# Patient Record
Sex: Male | Born: 1950 | Race: White | Hispanic: No | Marital: Married | Smoking: Current every day smoker
Health system: Southern US, Community
[De-identification: ages and names within clinical notes are randomized; demographics above are authoritative.]

## PROBLEM LIST (undated history)

## (undated) DIAGNOSIS — M1990 Unspecified osteoarthritis, unspecified site: Secondary | ICD-10-CM

## (undated) HISTORY — PX: WRIST SURGERY: SHX841

## (undated) HISTORY — DX: Unspecified osteoarthritis, unspecified site: M19.90

---

## 1999-02-28 ENCOUNTER — Emergency Department (HOSPITAL_COMMUNITY): Admission: EM | Admit: 1999-02-28 | Discharge: 1999-02-28 | Payer: Self-pay | Admitting: Emergency Medicine

## 1999-02-28 ENCOUNTER — Encounter: Payer: Self-pay | Admitting: Emergency Medicine

## 1999-03-04 ENCOUNTER — Emergency Department (HOSPITAL_COMMUNITY): Admission: EM | Admit: 1999-03-04 | Discharge: 1999-03-04 | Payer: Self-pay | Admitting: Emergency Medicine

## 2004-07-24 ENCOUNTER — Emergency Department (HOSPITAL_COMMUNITY): Admission: EM | Admit: 2004-07-24 | Discharge: 2004-07-25 | Payer: Self-pay | Admitting: Emergency Medicine

## 2014-04-28 HISTORY — PX: REPLACEMENT TOTAL KNEE: SUR1224

## 2014-05-21 HISTORY — PX: MANIPULATION KNEE JOINT: SUR841

## 2016-05-29 ENCOUNTER — Encounter: Payer: Self-pay | Admitting: Pulmonary Disease

## 2016-05-29 ENCOUNTER — Ambulatory Visit (INDEPENDENT_AMBULATORY_CARE_PROVIDER_SITE_OTHER): Payer: PRIVATE HEALTH INSURANCE | Admitting: Pulmonary Disease

## 2016-05-29 VITALS — BP 116/62 | HR 88 | Ht 69.5 in | Wt 148.4 lb

## 2016-05-29 DIAGNOSIS — R059 Cough, unspecified: Secondary | ICD-10-CM

## 2016-05-29 DIAGNOSIS — R05 Cough: Secondary | ICD-10-CM

## 2016-05-29 DIAGNOSIS — Z720 Tobacco use: Secondary | ICD-10-CM

## 2016-05-29 MED ORDER — ALBUTEROL SULFATE HFA 108 (90 BASE) MCG/ACT IN AERS: 2.0000 | INHALATION_SPRAY | Freq: Four times a day (QID) | RESPIRATORY_TRACT | 11 refills | Status: DC | PRN

## 2016-05-29 MED ORDER — BENZONATATE 100 MG PO CAPS: 100.0000 mg | ORAL_CAPSULE | Freq: Three times a day (TID) | ORAL | 2 refills | Status: DC | PRN

## 2016-05-29 MED ORDER — VARENICLINE TARTRATE 1 MG PO TABS: 1.0000 mg | ORAL_TABLET | Freq: Two times a day (BID) | ORAL | 3 refills | Status: DC

## 2016-05-29 NOTE — Progress Notes (Signed)
   Subjective:    Patient ID: Jeff Ball, male    DOB: 1950/09/04, 66 y.o.   MRN: [id number]  HPI    Review of Systems  Constitutional: Negative for fever and unexpected weight change.  HENT: Positive for congestion and dental problem. Negative for ear pain, nosebleeds, postnasal drip, rhinorrhea, sinus pressure, sore throat and trouble swallowing.   Eyes: Negative for redness and itching.  Respiratory: Positive for cough. Negative for chest tightness, shortness of breath and wheezing.   Cardiovascular: Negative for palpitations and leg swelling.  Gastrointestinal: Negative for nausea and vomiting.  Genitourinary: Negative for dysuria.  Musculoskeletal: Positive for arthralgias and joint swelling.  Skin: Negative for rash.  Neurological: Negative for headaches.  Hematological: Does not bruise/bleed easily.  Psychiatric/Behavioral: Negative for dysphoric mood. The patient is not nervous/anxious.        Objective:   Physical Exam        Assessment & Plan:

## 2016-05-29 NOTE — Progress Notes (Signed)
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  Family history His family history includes CVA in his father; Obesity in his sister.  Social history He  reports that he has been smoking Cigarettes.  He has a 100.00 pack-year smoking history. He has never used smokeless tobacco. He reports that he drinks about 18.0 oz of alcohol per week . He reports that he does not use drugs.  No Known Allergies   Review of Systems  Constitutional: Negative for fever and unexpected weight change.  HENT: Positive for congestion and dental problem. Negative for ear pain, nosebleeds, postnasal drip, rhinorrhea, sinus pressure, sore throat and trouble swallowing.   Eyes: Negative for redness and itching.  Respiratory: Positive for cough. Negative for chest tightness, shortness of breath and wheezing.   Cardiovascular: Negative for palpitations and leg swelling.  Gastrointestinal: Negative for nausea and vomiting.  Genitourinary: Negative for dysuria.  Musculoskeletal: Positive for arthralgias and joint swelling.  Skin: Negative for rash.  Neurological: Negative for headaches.  Hematological: Does not bruise/bleed easily.  Psychiatric/Behavioral: Negative for dysphoric mood. The patient is not nervous/anxious.     No current outpatient prescriptions on file prior to visit.   No current facility-administered medications on file prior to visit.     Chief Complaint  Patient presents with  . PULMONARY CONSULT    Referred by Jeff Soho, PA for COPD. Pt c/o cough in AM with mucus production.     Past medical history He  has a past medical history of Arthritis.  Vital signs BP 116/62 (BP Location: Left Arm, Cuff Size: Normal)   Pulse 88   Ht 5' 9.5" (1.765 m)   Wt 148 lb 6.4 oz (67.3 kg)   SpO2 98%   BMI 21.60 kg/m   History of present illness Jeff Ball is a  66 y.o. male smoker for evaluation of COPD.  He was seen by his PCP recently.  He has extensive history of smoking.  He has intermittent cough, and has lost some weight recently.  There was concern he could have emphysema, and he was referred for further assessment.  He doesn't feel like his breathing limits his activity.  He gets some cough with congestion, but usually when he has a cold.  He is not aware of wheezing.  He denies sinus congestion, asthma, or hemoptysis.  His phlegm is clear when he coughs.    He denies history of pneumonia or tuberculosis.  No animal/bird exposures.  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  Physical exam  General - No distress, thin ENT - No sinus tenderness, no oral exudate, no LAN, no thyromegaly, TM clear, pupils equal/reactive Cardiac - s1s2 regular, no murmur, pulses symmetric Chest - No wheeze/rales/dullness, good air entry, normal respiratory excursion Back - No focal tenderness Abd - Soft, non-tender, no organomegaly, + bowel sounds Ext - No edema Neuro - Normal strength, cranial nerves intact Skin - No rashes Psych - Normal mood, and behavior   Discussion He has extensive history of smoking, and intermittent cough.  He is at risk for COPD and lung cancer.   Assessment/plan  Cough. - will arrange for PFT's to further assess for COPD - will have him try prn ventolin - refill tessalon  Tobacco abuse. - he is willing to quit - will have him try chantix >> discussed dosing schedule, setting quit date,  and side effects to monitor for  Lung cancer screening. - will arrange for referral to Kandice Robinsons to learn more about low dose CT screening program for lung cancer   Patient Instructions  Tessalon perles 100 mg three times per day as needed for cough  Ventolin two puffs up to four times per day as needed for cough, wheeze, or chest congestion  Will arrange for pulmonary function test  Chantix 1 mg pill >> 1 pill daily for  1st 3 days, then 1 pill twice per day.  Stop smoking after you have been on Chantix for 1 week  Will arrange for referral to Kandice Robinsons to assess for lung cancer screening program  Follow up in 6 weeks with Dr. Craige Cotta or Nurse Practitioner     Coralyn Helling, MD [facility] Pager:  [phone] 05/29/2016, 4:14 PM

## 2016-05-29 NOTE — Patient Instructions (Signed)
Tessalon perles 100 mg three times per day as needed for cough  Ventolin two puffs up to four times per day as needed for cough, wheeze, or chest congestion  Will arrange for pulmonary function test  Chantix 1 mg pill >> 1 pill daily for 1st 3 days, then 1 pill twice per day.  Stop smoking after you have been on Chantix for 1 week  Will arrange for referral to Kandice Robinsons to assess for lung cancer screening program  Follow up in 6 weeks with Dr. Craige Cotta or Nurse Practitioner

## 2016-06-01 ENCOUNTER — Telehealth: Payer: Self-pay | Admitting: Pulmonary Disease

## 2016-06-01 NOTE — Telephone Encounter (Signed)
Spoke with pharmacist and changed chantix continuation pack to starter pack.  Per 05/29/16 avs pt is being started on this medication and is titrating up to full dosage.  rx changed.  Nothing further needed.

## 2016-06-05 ENCOUNTER — Telehealth: Payer: Self-pay | Admitting: Pulmonary Disease

## 2016-06-05 NOTE — Telephone Encounter (Signed)
Spoke with pt's wife, states that Chantix is not working well for patient- difficulty sleeping, jittery.  Pt stopped taking medication yesterday, is requesting a rx for nicotine patches.  Pt's wife states this is covered through their insurance if it's a written rx.  Pt's wife would like to know what strength VS is recommending. Pt uses [facility] in [place].  VS please advise.  Thanks!

## 2016-06-07 NOTE — Telephone Encounter (Signed)
Send script for nicotine patch 14 mg daily.

## 2016-06-08 NOTE — Telephone Encounter (Signed)
Attempted to contact pt's wife. No answer, no option to leave a message. Will try back.  

## 2016-06-11 MED ORDER — NICOTINE 14 MG/24HR TD PT24: 14.0000 mg | MEDICATED_PATCH | Freq: Every day | TRANSDERMAL | 0 refills | Status: AC

## 2016-06-11 NOTE — Telephone Encounter (Signed)
ATC pt's wife. No option for VM.  Called pt's mobile number and informed him of the recs per VS. Rx sent to preferred pharmacy. Pt verbalized understanding and denied any further questions or concerns at this time.

## 2016-07-04 ENCOUNTER — Other Ambulatory Visit: Payer: Self-pay | Admitting: Acute Care

## 2016-07-04 DIAGNOSIS — F17210 Nicotine dependence, cigarettes, uncomplicated: Secondary | ICD-10-CM

## 2016-07-06 ENCOUNTER — Ambulatory Visit (INDEPENDENT_AMBULATORY_CARE_PROVIDER_SITE_OTHER): Payer: PRIVATE HEALTH INSURANCE | Admitting: Acute Care

## 2016-07-06 ENCOUNTER — Encounter: Payer: Self-pay | Admitting: Acute Care

## 2016-07-06 ENCOUNTER — Ambulatory Visit (INDEPENDENT_AMBULATORY_CARE_PROVIDER_SITE_OTHER)
Admission: RE | Admit: 2016-07-06 | Discharge: 2016-07-06 | Disposition: A | Discharge status: Home / Self Care | Payer: PRIVATE HEALTH INSURANCE | Source: Ambulatory Visit | Attending: Acute Care | Admitting: Acute Care

## 2016-07-06 DIAGNOSIS — Z87891 Personal history of nicotine dependence: Secondary | ICD-10-CM

## 2016-07-06 DIAGNOSIS — F17210 Nicotine dependence, cigarettes, uncomplicated: Secondary | ICD-10-CM

## 2016-07-06 NOTE — Progress Notes (Signed)
Shared Decision Making Visit Lung Cancer Screening Program [phone])   Eligibility:  Age 66 y.o.  Pack Years Smoking History Calculation 71 pack year smoking history (# packs/per year x # years smoked)  Recent History of coughing up blood  no  Unexplained weight loss? no ( >Than 15 pounds within the last 6 months )  Prior History Lung / other cancer no (Diagnosis within the last 5 years already requiring surveillance chest CT Scans).  Smoking Status Current Smoker  Former Smokers: Years since quit: NA  Quit Date: NA  Visit Components:  Discussion included one or more decision making aids. yes  Discussion included risk/benefits of screening.yes  Discussion included potential follow up diagnostic testing for abnormal scans. yes  Discussion included meaning and risk of over diagnosis. yes  Discussion included meaning and risk of False Positives. yes  Discussion included meaning of total radiation exposure. yes  Counseling Included:  Importance of adherence to annual lung cancer LDCT screening. yes  Impact of comorbidities on ability to participate in the program. yes  Ability and willingness to under diagnostic treatment. yes  Smoking Cessation Counseling:  Current Smokers:   Discussed importance of smoking cessation. yes  Information about tobacco cessation classes and interventions provided to patient. yes  Patient provided with "ticket" for LDCT Scan. yes  Symptomatic Patient. no  Counseling  Diagnosis Code: Tobacco Use Z72.0  Asymptomatic Patient yes  Counseling (Intermediate counseling: > three minutes counseling) K8768  Former Smokers:   Discussed the importance of maintaining cigarette abstinence. yes  Diagnosis Code: Personal History of Nicotine Dependence. T15.726  Information about tobacco cessation classes and interventions provided to patient. Yes  Patient provided with "ticket" for LDCT Scan. yes  Written Order for Lung Cancer  Screening with LDCT placed in Epic. Yes (CT Chest Lung Cancer Screening Low Dose W/O CM) [id number] Z12.2-Screening of respiratory organs Z87.891-Personal history of nicotine dependence  I have spent25 minutes of face to face time with Mr. Weisenberger discussing the risks and benefits of lung cancer screening. We viewed a power point together that explained in detail the above noted topics. We paused at intervals to allow for questions to be asked and answered to ensure understanding.We discussed that the single most powerful action that he can take to decrease his risk of developing lung cancer is to quit smoking. We discussed whether or not he is ready to commit to setting a quit date. He is currently not ready to set a quit date. We discussed options for tools to aid in quitting smoking including nicotine replacement therapy, non-nicotine medications, support groups, Quit Smart classes, and behavior modification. We discussed that often times setting smaller, more achievable goals, such as eliminating 1 cigarette a day for a week and then 2 cigarettes a day for a week can be helpful in slowly decreasing the number of cigarettes smoked. This allows for a sense of accomplishment as well as providing a clinical benefit. I gave Mr. Andaya the " Be Stronger Than Your Excuses" card with contact information for community resources, classes, free nicotine replacement therapy, and access to mobile apps, text messaging, and on-line smoking cessation help. I have also given him my card and contact information in the event he needs to contact me. We discussed the time and location of the scan, and that either Jerolyn Shin, CMA, or I will call with the results within 24-48 hours of receiving them. I have provided Mr. Shy with a copy of the power point we viewed  as a resource in the event they need reinforcement of the concepts we discussed today in the office. The patient verbalized understanding of all of  the above and had  no further questions upon leaving the office. They have my contact information in the event they have any further questions.  We discussed the fact that there is a high incidence of coronary artery disease noted on these exams. I explained that this is a non-gated exam and therefore degree of severity cannot be determined. I explained that the results of this scan would be faxed to the patient's primary care physician. I advised him to follow-up with his primary care provider regarding any findings of coronary artery disease.  I spent between 4 and 5 minutes counseling on smoking cessation this visit  Bevelyn Ngo, NP 07/06/2016

## 2016-07-10 ENCOUNTER — Ambulatory Visit: Payer: PRIVATE HEALTH INSURANCE | Admitting: Adult Health

## 2016-07-10 ENCOUNTER — Telehealth: Payer: Self-pay | Admitting: Acute Care

## 2016-07-10 NOTE — Telephone Encounter (Signed)
Spoke with pt's spouse, who is requesting pt's CT results from 07-09-16.  Kandice Robinsons please advise. Thanks.

## 2016-07-11 ENCOUNTER — Other Ambulatory Visit: Payer: Self-pay | Admitting: Acute Care

## 2016-07-11 DIAGNOSIS — F17210 Nicotine dependence, cigarettes, uncomplicated: Secondary | ICD-10-CM

## 2016-07-11 NOTE — Telephone Encounter (Signed)
I called Mr. Jeff Ball with results of his low-dose screening CT. I explained that his scan was read as a lung RADS 2, nodules that are benign in appearance or behavior. Recommendation per radiology is for continued annual screening with low-dose chest CT in 12 months. I explained that we will order and schedule the exam for February 2019. I did discuss the additional finding of aortic atherosclerosis and coronary artery calcification. I explained that in this non-gated exam degree or severity could not be determined. He is currently not on statin medication, however he has recently had his cholesterol checked and he states that he will follow-up with his primary care physician. He verbalized understanding of the above and had no further questions at completion of the call.

## 2016-09-14 ENCOUNTER — Telehealth: Payer: Self-pay | Admitting: Pulmonary Disease

## 2016-09-14 NOTE — Telephone Encounter (Signed)
atc pt X3, line rang to fast busy signal wcb 

## 2016-09-14 NOTE — Telephone Encounter (Signed)
Can be reached @ [phone].Caren Griffins

## 2016-09-17 NOTE — Telephone Encounter (Signed)
Pt is requesting a refill of his Tessalon Perles and also a new Rx for a cough syrup.  Please advise Dr Craige Cotta. Thanks.

## 2016-09-17 NOTE — Telephone Encounter (Signed)
[phone] calling a/b refill request.[name]'

## 2016-09-17 NOTE — Telephone Encounter (Signed)
atc pt at callback number listed X3, message states "your call cannot be completed as dialed- please try again later". atc pt on mobile number listed, no answer, vm full.  wcb.

## 2016-09-19 MED ORDER — BENZONATATE 100 MG PO CAPS: 100.0000 mg | ORAL_CAPSULE | Freq: Three times a day (TID) | ORAL | 3 refills | Status: DC | PRN

## 2016-09-19 NOTE — Telephone Encounter (Signed)
Okay to send script for tessalon 100 mg tid prn, #30 with 3 refills.  He can try OTC delsym 30 ml bid prn for cough.    If this does not improve his symptoms then he can call back for additional advise.

## 2016-09-19 NOTE — Telephone Encounter (Signed)
Spoke with the pt and notified of recs per VS  He verbalized understanding  Rx sent to pharm

## 2017-04-13 IMAGING — CT CT CHEST LUNG CANCER SCREENING LOW DOSE W/O CM
2 of 5 series · 15 of 40 positions shown, 18 images · non-contrast
Comparison: None.

CLINICAL DATA: Low dose Lung cancer screening. Seventy pack-year
history.

EXAM:
CT CHEST WITHOUT CONTRAST LOW-DOSE FOR LUNG CANCER SCREENING
TECHNIQUE: Multidetector CT imaging of the chest was performed following the
standard protocol without IV contrast.

[Series 4: lung thins 1.0 · axial · 0.74mm/px · z∈[-242,+38]mm · 12 of 308 slices shown, 15 images]
[im 14/308  mediastinal]
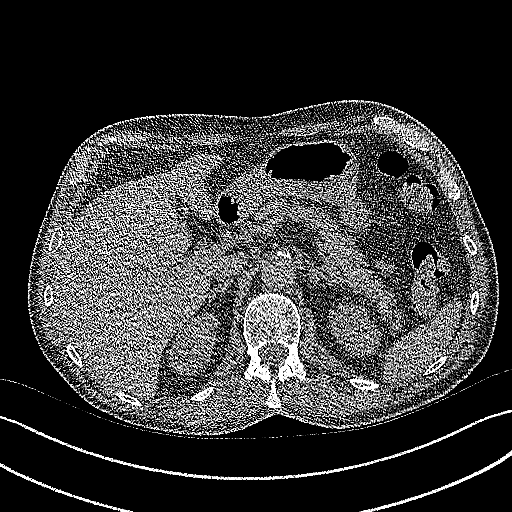
[im 14/308  lung]
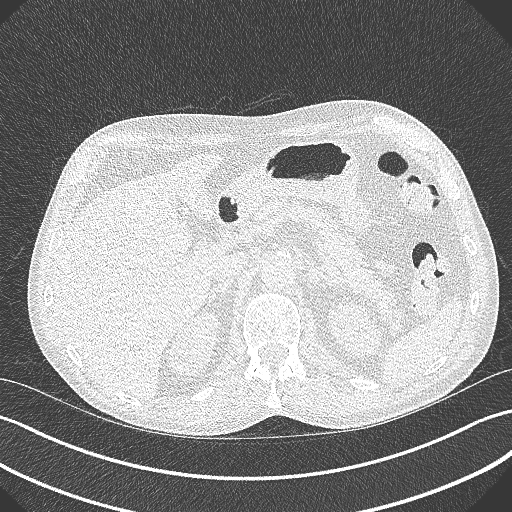
[im 42/308  lung]
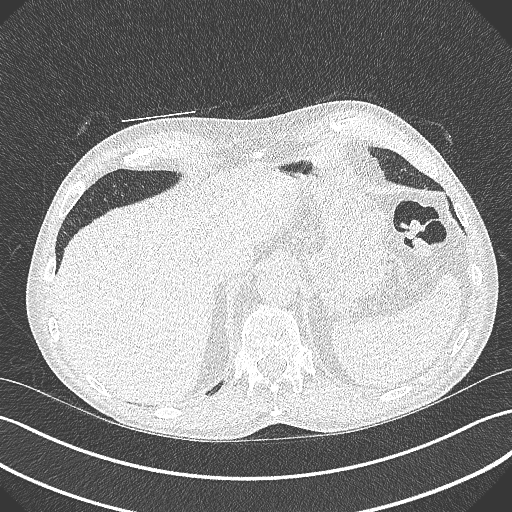
[im 70/308  lung]
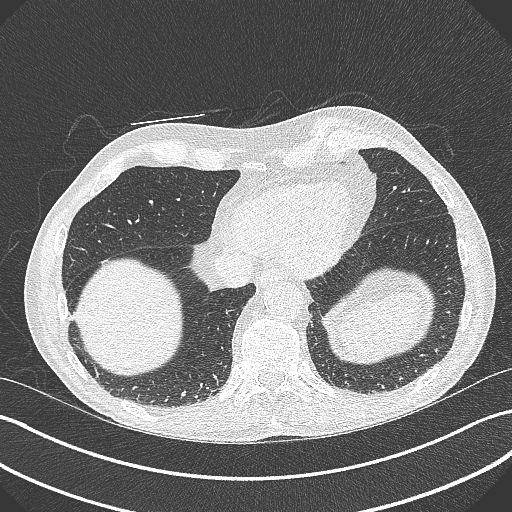
[im 98/308  lung]
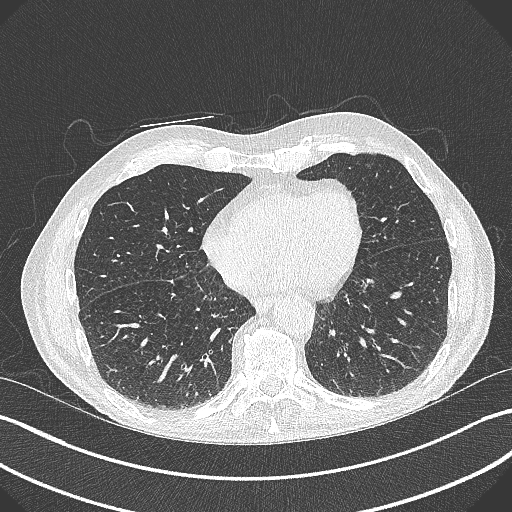
[im 112/308  mediastinal]
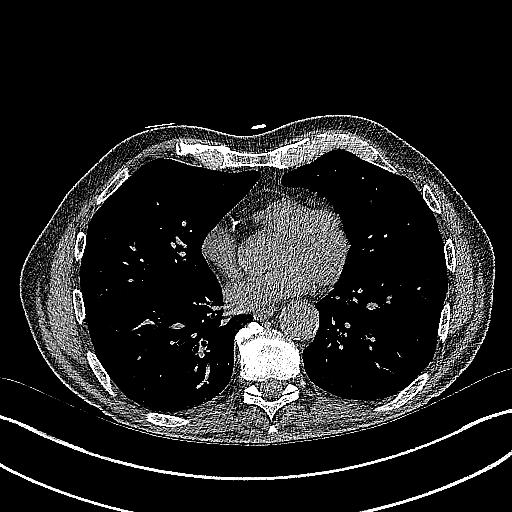
[im 112/308  lung]
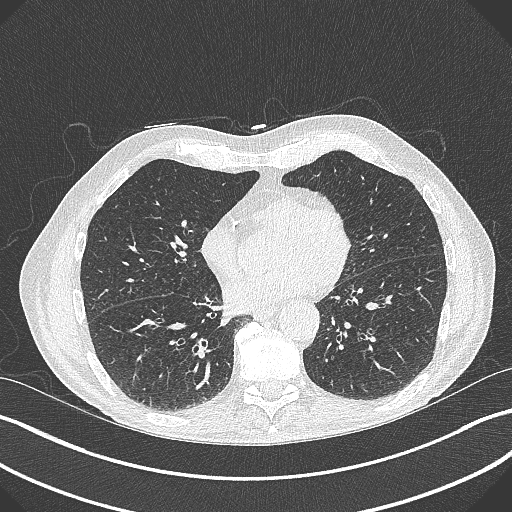
[im 140/308  lung]
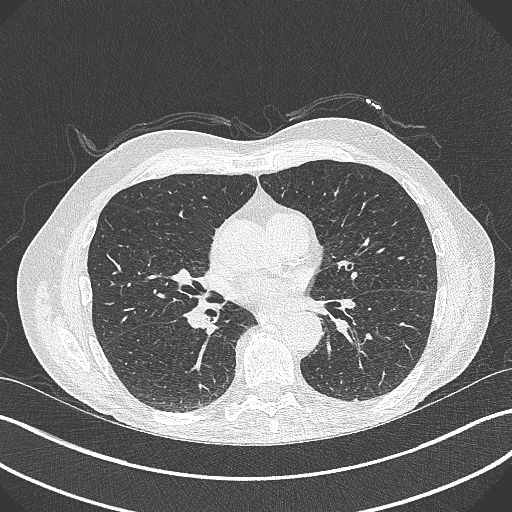
[im 168/308  lung]
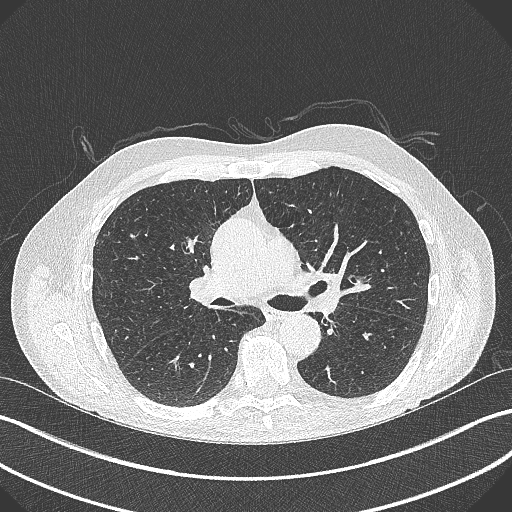
[im 196/308  lung]
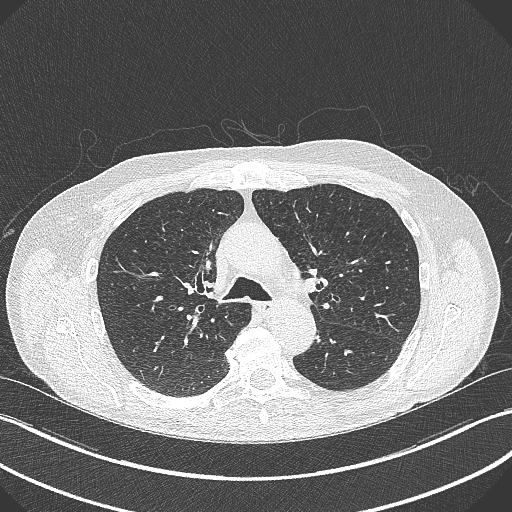
[im 210/308  mediastinal]
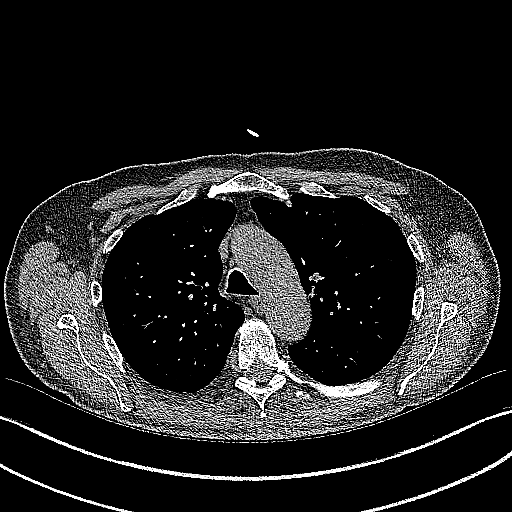
[im 210/308  lung]
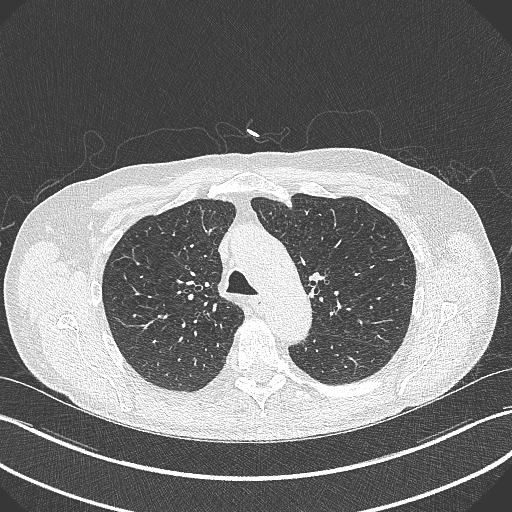
[im 238/308  lung]
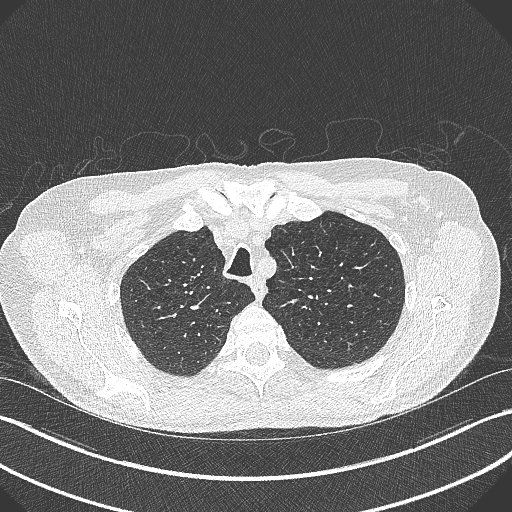
[im 266/308  lung]
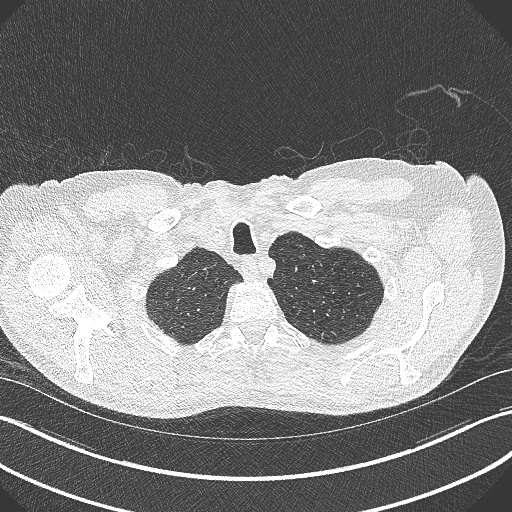
[im 294/308  lung]
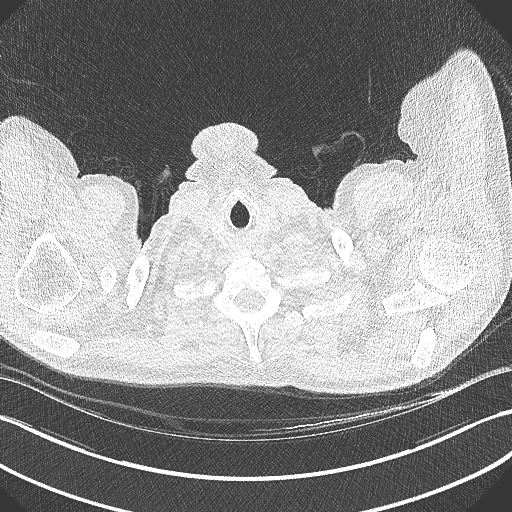

[Series 6: coronal · coronal · 0.63mm/px · 3 of 120 slices shown]
[im 24/120  lung]
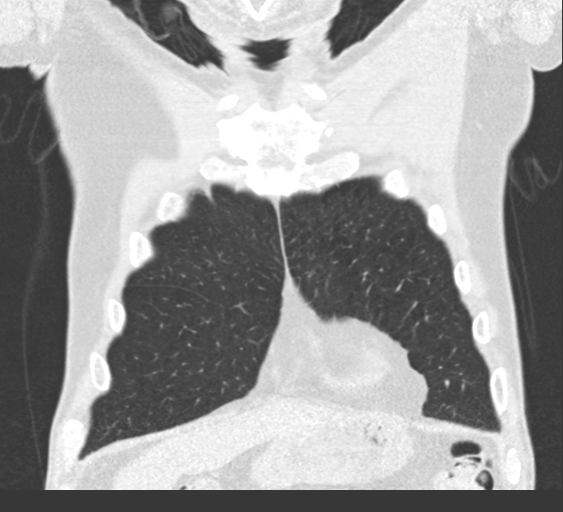
[im 48/120  lung]
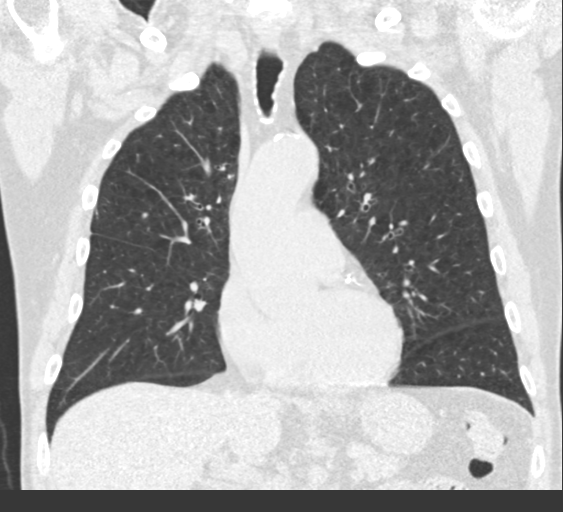
[im 72/120  lung]
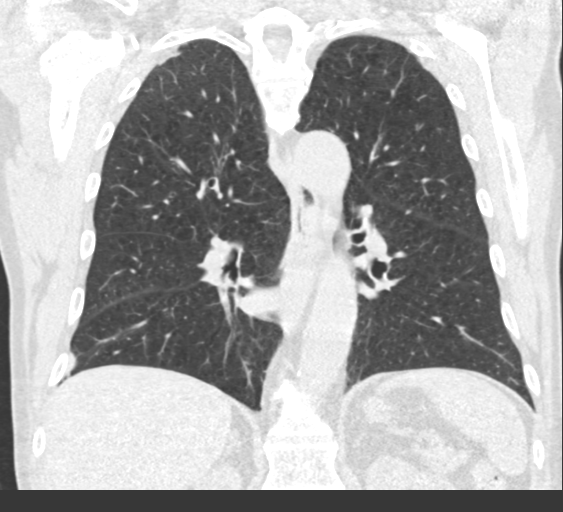

[15 of 40 positions shown; findings below may reference images not displayed]

FINDINGS: Cardiovascular: The heart size appears. Aortic atherosclerosis.
Calcification involving the RCA, LAD coronary artery noted. No
pericardial effusion.

Mediastinum/Nodes: The trachea appears patent and is midline. Normal
appearance of the esophagus. No enlarged mediastinal or hilar lymph
nodes. No axillary or supraclavicular adenopathy.

Lungs/Pleura: No pleural fluid. Mild changes of centrilobular
emphysema identified. Diffuse bronchial wall thickening. A few small
scattered pulmonary nodules are identified. The largest is in the
superior segment of low right lower lobe measuring 4.4 mm.

Upper Abdomen: No acute abnormality.

Musculoskeletal: No aggressive lytic or sclerotic bone lesions.
IMPRESSION: 1. Lung-RADS Category 2, benign appearance or behavior. Continue
annual screening with low-dose chest CT without contrast in 12
months
2. Aortic atherosclerosis and coronary artery calcification.
3. Diffuse bronchial wall thickening with emphysema, as above;
imaging findings suggestive of underlying COPD.

## 2017-06-03 ENCOUNTER — Telehealth: Payer: Self-pay | Admitting: Pulmonary Disease

## 2017-06-03 NOTE — Telephone Encounter (Signed)
Called and spoke with pt letting him know we needed him to come in for an ov before we sent in a Rx of his tessalon. Pt was supposed to have had an appt with Rubye Oaks, NP 07/10/16 with a PFT before but he no showed both of those visits.  Pt did not know what I was talking about stating to me that he had all the tests done and stated he did not no show for any visits.  Pt wanted me to speak with his wife to relay the information I just stated to him.  Spoke with pt's wife relaying the information I had just stated to her husband about [place] needing him to come in for an ov after he no showed for the pft and ov with TP.  Pt's wife stated when he was there on 07/06/16 he went over to [facility] and had a test done that same day.  I stated to her that that was the CT scan for the lung screening program, not the PFT which he was supposed to have had on 07/10/16 which was a follow up appt after his visit with VS when he had the initial consult.  Information I relayed to them was finally understood as pt's wife stated she ended up having to work on 07/10/16 and stated she forgot to call our office to cancel pt's appts.  appt was scheduled for 06/21/17 for a PFT followed by an OV with TP. Stated to them that at that visit, we could refill tessalon if okayed by TP but would wait to see if pt showed up for that visit first.  Nothing further needed.

## 2017-06-20 ENCOUNTER — Other Ambulatory Visit: Payer: Self-pay | Admitting: Pulmonary Disease

## 2017-06-20 DIAGNOSIS — R0600 Dyspnea, unspecified: Secondary | ICD-10-CM

## 2017-06-21 ENCOUNTER — Ambulatory Visit (INDEPENDENT_AMBULATORY_CARE_PROVIDER_SITE_OTHER): Payer: Medicare Other | Admitting: Pulmonary Disease

## 2017-06-21 ENCOUNTER — Encounter: Payer: Self-pay | Admitting: Adult Health

## 2017-06-21 ENCOUNTER — Ambulatory Visit (INDEPENDENT_AMBULATORY_CARE_PROVIDER_SITE_OTHER): Payer: Medicare Other | Admitting: Adult Health

## 2017-06-21 DIAGNOSIS — F17200 Nicotine dependence, unspecified, uncomplicated: Secondary | ICD-10-CM | POA: Diagnosis not present

## 2017-06-21 DIAGNOSIS — IMO0001 Reserved for inherently not codable concepts without codable children: Secondary | ICD-10-CM | POA: Insufficient documentation

## 2017-06-21 DIAGNOSIS — R0600 Dyspnea, unspecified: Secondary | ICD-10-CM | POA: Diagnosis not present

## 2017-06-21 DIAGNOSIS — J449 Chronic obstructive pulmonary disease, unspecified: Secondary | ICD-10-CM

## 2017-06-21 LAB — PULMONARY FUNCTION TEST
DL/VA % PRED: 65 %
DL/VA: 2.98 ml/min/mmHg/L
DLCO unc % pred: 51 %
DLCO unc: 16.17 ml/min/mmHg
FEF 25-75 POST: 0.63 L/s
FEF 25-75 Pre: 1.2 L/sec
FEF2575-%Change-Post: -47 %
FEF2575-%PRED-POST: 23 %
FEF2575-%PRED-PRE: 44 %
FEV1-%Change-Post: -20 %
FEV1-%PRED-PRE: 59 %
FEV1-%Pred-Post: 46 %
FEV1-PRE: 2.01 L
FEV1-Post: 1.59 L
FEV1FVC-%CHANGE-POST: -8 %
FEV1FVC-%PRED-PRE: 77 %
FEV6-%Change-Post: -12 %
FEV6-%Pred-Post: 69 %
FEV6-%Pred-Pre: 79 %
FEV6-POST: 3.01 L
FEV6-Pre: 3.45 L
FEV6FVC-%Change-Post: 0 %
FEV6FVC-%Pred-Post: 105 %
FEV6FVC-%Pred-Pre: 104 %
FVC-%Change-Post: -13 %
FVC-%PRED-POST: 66 %
FVC-%Pred-Pre: 76 %
FVC-POST: 3.01 L
FVC-PRE: 3.47 L
POST FEV1/FVC RATIO: 53 %
PRE FEV1/FVC RATIO: 58 %
PRE FEV6/FVC RATIO: 99 %
Post FEV6/FVC ratio: 100 %
RV % pred: 129 %
RV: 2.97 L
TLC % pred: 84 %
TLC: 5.87 L

## 2017-06-21 MED ORDER — BENZONATATE 100 MG PO CAPS: 100.0000 mg | ORAL_CAPSULE | Freq: Three times a day (TID) | ORAL | 3 refills | Status: DC | PRN

## 2017-06-21 NOTE — Progress Notes (Signed)
@Patient  ID: Jeff Ball, male    DOB: Jul 09, 1950, 67 y.o.   MRN: [id number]  Chief Complaint  Patient presents with  . Follow-up    Cough     Referring provider: Sigmund Hazel, MD  HPI:  67 year old male active smoker (heavy 2 PPD )  followed for COPD  TEST   06/21/2017 Follow up : COPD  Patient presents for a one year follow-up.  Patient was seen last visit 1 year ago for an evaluation for COPD.  He did not return until today for a PFT.  PFT today shows moderate COPD with an FEV1 at 59%, ratio 58, FVC 76% DLCO 51%.  Patient continues to smoke.  Smoking cessation was discussed.  Patient underwent lung cancer low-dose CT screening February 2018 that showed a benign appearance he has a follow-up scheduled later this month.  CT did show emphysema.. He says gets sob with walking long distance. Can not do a lot of activities due to knee issues. Disabled due to knees.  Has no intention to quit smoking . Says as long as they make them he is going to smoke them.  We discussed  Uses Albuterol inhaler rarely.  Wife is with him today , they did a quite of bit of arguing during exam.   He is skydiver .   No Known Allergies  Immunization History  Administered Date(s) Administered  . Tdap 07/08/2014    Past Medical History:  Diagnosis Date  . Arthritis     Tobacco History: Social History   Tobacco Use  Smoking Status Current Every Day Smoker  . Packs/day: 2.50  . Years: 40.00  . Pack years: 100.00  . Types: Cigarettes  Smokeless Tobacco Never Used   Ready to quit: No Counseling given: Yes   Outpatient Encounter Medications as of 06/21/2017  Medication Sig  . albuterol (VENTOLIN HFA) 108 (90 Base) MCG/ACT inhaler Inhale 2 puffs into the lungs every 6 (six) hours as needed for wheezing or shortness of breath.  . Aspirin-Acetaminophen (GOODY BODY PAIN) 500-325 MG PACK as needed.  . benzonatate (TESSALON) 100 MG capsule Take 1 capsule (100 mg total) by mouth 3 (three) times  daily as needed for cough.  . Homeopathic Products (ZICAM COLD REMEDY) TBDP Take 1 tablet by mouth every 4 (four) hours as needed.  . nicotine (NICODERM CQ - DOSED IN MG/24 HOURS) 14 mg/24hr patch Place 1 patch (14 mg total) onto the skin daily.  Marland Kitchen Dextromethorphan-Guaifenesin (TUSSIN DM PO) Take by mouth.  . [DISCONTINUED] varenicline (CHANTIX) 1 MG tablet Take 1 tablet (1 mg total) by mouth 2 (two) times daily. (Patient not taking: Reported on 06/21/2017)   No facility-administered encounter medications on file as of 06/21/2017.      Review of Systems  Constitutional:   No  weight loss, night sweats,  Fevers, chills, fatigue, or  lassitude.  HEENT:   No headaches,  Difficulty swallowing,  Tooth/dental problems, or  Sore throat,                No sneezing, itching, ear ache, nasal congestion, post nasal drip,   CV:  No chest pain,  Orthopnea, PND, swelling in lower extremities, anasarca, dizziness, palpitations, syncope.   GI  No heartburn, indigestion, abdominal pain, nausea, vomiting, diarrhea, change in bowel habits, loss of appetite, bloody stools.   Resp: No shortness of breath with exertion or at rest.  No excess mucus, no productive cough,  No non-productive cough,  No coughing up  of blood.  No change in color of mucus.  No wheezing.  No chest wall deformity  Skin: no rash or lesions.  GU: no dysuria, change in color of urine, no urgency or frequency.  No flank pain, no hematuria   MS:  No joint pain or swelling.  No decreased range of motion.  No back pain.    Physical Exam  BP 130/80 (BP Location: Left Arm, Cuff Size: Normal)   Pulse 85   Ht 5\' 9"  (1.753 m)   Wt 138 lb (62.6 kg)   SpO2 97%   BMI 20.38 kg/m   GEN: A/Ox3; pleasant , NAD, thin , walks with cane.    HEENT:  [place]/AT,  EACs-clear, TMs-wnl, NOSE-clear, THROAT-clear, no lesions, no postnasal drip or exudate noted. Poor dentition   NECK:  Supple w/ fair ROM; no JVD; normal carotid impulses w/o bruits; no  thyromegaly or nodules palpated; no lymphadenopathy.    RESP  Clear  P & A; w/o, wheezes/ rales/ or rhonchi. no accessory muscle use, no dullness to percussion  CARD:  RRR, no m/r/g, no peripheral edema, pulses intact, no cyanosis or clubbing.  GI:   Soft & nt; nml bowel sounds; no organomegaly or masses detected.   Musco: Warm bil, no deformities or joint swelling noted.   Neuro: alert, no focal deficits noted.    Skin: Warm, no lesions or rashes    Lab Results:  CBC No results found for: WBC, RBC, HGB, HCT, PLT, MCV, MCH, MCHC, RDW, LYMPHSABS, MONOABS, EOSABS, BASOSABS  BMET No results found for: NA, K, CL, CO2, GLUCOSE, BUN, CREATININE, CALCIUM, GFRNONAA, GFRAA  BNP No results found for: BNP  ProBNP No results found for: PROBNP  Imaging: No results found.   Assessment & Plan:   COPD (chronic obstructive pulmonary disease) (HCC) Moderate COPD -active smoker   Plan  Patient Instructions  Work on cutting back on smoking . Get down to <1 pack a day .  Use Albuterol As needed   Follow up with Dr. Craige Cotta  In 6 months and As needed        Smoking Smoking cessation      Rubye Oaks, NP 06/21/2017

## 2017-06-21 NOTE — Progress Notes (Signed)
PFT completed today 06/21/17  

## 2017-06-21 NOTE — Addendum Note (Signed)
Addended by: Pilar Grammes on: 06/21/2017 04:16 PM   Modules accepted: Orders

## 2017-06-21 NOTE — Assessment & Plan Note (Signed)
Smoking cessation  

## 2017-06-21 NOTE — Assessment & Plan Note (Signed)
Moderate COPD -active smoker   Plan  Patient Instructions  Work on cutting back on smoking . Get down to <1 pack a day .  Use Albuterol As needed   Follow up with Dr. Craige Cotta  In 6 months and As needed

## 2017-06-21 NOTE — Patient Instructions (Addendum)
Work on cutting back on smoking . Get down to <1 pack a day .  Use Albuterol As needed   Follow up with Dr. Craige Cotta  In 6 months and As needed

## 2017-06-25 NOTE — Progress Notes (Signed)
Reviewed and agree with assessment/plan.   Jeneen Doutt, MD [place] [facility] 05/16/2016, 12:24 PM Pager:  [phone]  

## 2017-07-09 ENCOUNTER — Ambulatory Visit (HOSPITAL_BASED_OUTPATIENT_CLINIC_OR_DEPARTMENT_OTHER)
Admission: RE | Admit: 2017-07-09 | Discharge: 2017-07-09 | Disposition: A | Discharge status: Home / Self Care | Payer: Medicare Other | Source: Ambulatory Visit | Attending: Acute Care | Admitting: Acute Care

## 2017-07-09 DIAGNOSIS — I700 Atherosclerosis of aorta: Secondary | ICD-10-CM | POA: Insufficient documentation

## 2017-07-09 DIAGNOSIS — J432 Centrilobular emphysema: Secondary | ICD-10-CM | POA: Diagnosis not present

## 2017-07-09 DIAGNOSIS — F17210 Nicotine dependence, cigarettes, uncomplicated: Secondary | ICD-10-CM | POA: Insufficient documentation

## 2017-07-09 DIAGNOSIS — Z122 Encounter for screening for malignant neoplasm of respiratory organs: Secondary | ICD-10-CM | POA: Diagnosis not present

## 2017-07-15 ENCOUNTER — Telehealth: Payer: Self-pay | Admitting: Adult Health

## 2017-07-15 NOTE — Telephone Encounter (Signed)
ATC pt, no answer. Left message for pt to call back.  

## 2017-07-15 NOTE — Telephone Encounter (Signed)
  Please call patient and let him know his low dose CT was read as a Lung RADS 2: nodules that are benign in appearance and behavior with a very low likelihood of becoming a clinically active cancer due to size or lack of growth. Recommendation per radiology is for a repeat LDCT in 12 months.Please let him know we will order and schedule his annual screening scan for 06/2018. Please let him know his scan did show CAD. This was also noted on last years scan. Per Epic he is not on statin therapy.  Please remind the patient  that this is a non-gated exam therefore degree or severity of disease  cannot be determined. Please have him follow up with his PCP regarding potential risk factor modification, dietary therapy or pharmacologic therapy if clinically indicated.  Angelique Blonder, Please place order for annual CT scan for 06/2018 and fax results to PCP. Thanks so much.    IMPRESSION: 1. Lung-RADS 2, benign appearance or behavior. Continue annual screening with low-dose chest CT without contrast in 12 months. 2. Three-vessel coronary atherosclerosis.

## 2017-07-15 NOTE — Telephone Encounter (Signed)
SG please advise on CT results.  

## 2017-07-16 MED ORDER — ALBUTEROL SULFATE HFA 108 (90 BASE) MCG/ACT IN AERS: 2.0000 | INHALATION_SPRAY | Freq: Four times a day (QID) | RESPIRATORY_TRACT | 11 refills | Status: AC | PRN

## 2017-07-16 MED ORDER — BENZONATATE 100 MG PO CAPS: 100.0000 mg | ORAL_CAPSULE | Freq: Three times a day (TID) | ORAL | 3 refills | Status: DC | PRN

## 2017-07-16 NOTE — Telephone Encounter (Signed)
Kerrie Buffalo", patient's wife returning call, CB is [phone]

## 2017-07-16 NOTE — Telephone Encounter (Signed)
Called and spoke with pt's wife Graciella Belton letting her know the results of pt's CT scan and that we would have him repeat scan 1 year from now.  Also stated to her that pt needs to follow up with PCP to discuss options based on CAD that was seen on CT scan.  Pt not currently on any statin med.  Graciella Belton stated that pt needed a refill of tessalon as well as albuterol inhaler.  I stated to her I would take care of calling those in to pt's pharmacy. Nothing further needed at this current time.

## 2017-07-16 NOTE — Telephone Encounter (Signed)
Called pt, no vm to leave message

## 2017-07-17 ENCOUNTER — Other Ambulatory Visit: Payer: Self-pay | Admitting: Acute Care

## 2017-07-17 DIAGNOSIS — F17210 Nicotine dependence, cigarettes, uncomplicated: Principal | ICD-10-CM

## 2017-07-17 DIAGNOSIS — Z122 Encounter for screening for malignant neoplasm of respiratory organs: Secondary | ICD-10-CM

## 2017-12-20 ENCOUNTER — Ambulatory Visit: Payer: Medicare Other | Admitting: Pulmonary Disease

## 2018-01-13 ENCOUNTER — Ambulatory Visit (INDEPENDENT_AMBULATORY_CARE_PROVIDER_SITE_OTHER): Payer: Medicare Other | Admitting: Pulmonary Disease

## 2018-01-13 ENCOUNTER — Encounter: Payer: Self-pay | Admitting: Pulmonary Disease

## 2018-01-13 ENCOUNTER — Other Ambulatory Visit: Payer: Medicare Other

## 2018-01-13 VITALS — BP 128/70 | HR 86 | Ht 69.0 in | Wt 137.4 lb

## 2018-01-13 DIAGNOSIS — Z720 Tobacco use: Secondary | ICD-10-CM

## 2018-01-13 DIAGNOSIS — J432 Centrilobular emphysema: Secondary | ICD-10-CM | POA: Diagnosis not present

## 2018-01-13 DIAGNOSIS — Z23 Encounter for immunization: Secondary | ICD-10-CM

## 2018-01-13 NOTE — Patient Instructions (Signed)
Prevnar shot today Lab test today Follow up in 6 months

## 2018-01-13 NOTE — Progress Notes (Signed)
[facility]  Chief Complaint  Patient presents with  . Follow-up    COPD follow-up    Constitutional: BP 128/70 (BP Location: Left Arm, Patient Position: Sitting, Cuff Size: Normal)   Pulse 86   Ht 5\' 9"  (1.753 m)   Wt 137 lb 6.4 oz (62.3 kg)   SpO2 97%   BMI 20.29 kg/m   History of Present Illness: Jeff Ball is a 67 y.o. 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  He still smokes.  He likes to smoke too much, and isn't interested in trying to quit at this time.  He isn't having cough, wheeze, chest congestion, chest pain, fever, hemoptysis, sinus congestion, sore throat, dysphagia, abdominal pain, or leg swelling.  He doesn't feel his breathing limits his activity, but he is not very active.  He has trouble walking related to knee pain.  His PFT showed mild to moderate obstruction and diffusion defect.  His CT chest showed emphysema.   Comprehensive Respiratory Exam:  Appearance - thin ENMT - nasal mucosa moist, turbinates clear, midline nasal septum, poor dentition, no gingival bleeding, no oral exudates, no tonsillar hypertrophy Neck - no masses, trachea midline, no thyromegaly, no elevation in JVP Respiratory - normal appearance of chest wall, normal respiratory effort w/o accessory muscle use, no dullness on percussion, no wheezing or rales CV - s1s2 regular rate and rhythm, no murmurs, no peripheral edema, radial pulses symmetric GI - soft, non tender, no masses Lymph - no adenopathy noted in neck and axillary areas MSK - normal muscle strength and tone, walks with a cane Ext - no cyanosis, clubbing, or joint inflammation noted Skin - no rashes, lesions, or ulcers Neuro - oriented to person, place, and time Psych - normal mood and affect   Assessment/Plan:  COPD with emphysema. - reviewed his CT chest and PFT findings - continue prn albuterol for now - discussed natural history of disease process, especially if he continues  to smoke - prevnar shot today - he will plan to get flu shot later this season - check A1AT level - will need pneumovax after August of 2020  Tobacco abuse. - discussed options to assist with smoking cessation - he will think about these and call if he feels he needs help to quit smoking   Patient Instructions  Prevnar shot today Lab test today Follow up in 6 months    Coralyn Helling, MD [facility]/Critical Care 01/13/2018, 12:42 PM  Flow Sheet  Pulmonary tests: PFT 06/21/17 >> FEV1 2.01 (59%), FEV1% 58, TLC 5.87 (84%), DLCO 51%, no BD CT chest 07/10/17 (reviewed by me) >> moderate centrilobular emphysema, atherosclerosis  Past Medical History: He  has a past medical history of Arthritis.  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  Family History: His family history includes CVA in his father; Obesity in his sister.  Social History: He  reports that he has been smoking cigarettes. He has a 100.00 pack-year smoking history. He has never used smokeless tobacco. He reports that he drinks about 30.0 standard drinks of alcohol per week. He reports that he does not use drugs.  Medications: Allergies as of 01/13/2018   No Known Allergies     Medication List        Accurate as of 01/13/18 12:42 PM. Always use your most recent med list.  albuterol 108 (90 Base) MCG/ACT inhaler Commonly known as:  PROVENTIL HFA;VENTOLIN HFA Inhale 2 puffs into the lungs every 6 (six) hours as needed for wheezing or shortness of breath.   benzonatate 100 MG capsule Commonly known as:  TESSALON Take 1 capsule (100 mg total) by mouth 3 (three) times daily as needed for cough.   GOODY BODY PAIN 500-325 MG Pack Generic drug:  Aspirin-Acetaminophen as needed.   nicotine 14 mg/24hr patch Commonly known as:  NICODERM CQ - dosed in mg/24 hours Place 1  patch (14 mg total) onto the skin daily.   TUSSIN DM PO Take by mouth.   ZICAM COLD REMEDY Tbdp Take 1 tablet by mouth every 4 (four) hours as needed.

## 2018-01-16 LAB — ALPHA-1 ANTITRYPSIN PHENOTYPE: A-1 Antitrypsin, Ser: 180 mg/dL (ref 83–199)

## 2018-01-17 ENCOUNTER — Telehealth: Payer: Self-pay | Admitting: Pulmonary Disease

## 2018-01-17 NOTE — Telephone Encounter (Signed)
LMTCB

## 2018-01-21 NOTE — Telephone Encounter (Signed)
LMTCB

## 2018-01-21 NOTE — Telephone Encounter (Signed)
Called and spoke with the patient and made him aware of these results and he verbalized understanding.Nothing further needed at this time.

## 2018-01-21 NOTE — Telephone Encounter (Signed)
Called patient unable to reach left message to give us a call back.

## 2018-01-21 NOTE — Telephone Encounter (Signed)
A1AT 01/13/18 >> 180, MZ   Attempted to call pt.  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  His levels are adequate, and nothing additional needed for him except it is absolutely necessary that he stop smoking.    He should also inform his blood relatives of this, and then they can d/w their doctors about whether testing for them is needed.

## 2018-01-21 NOTE — Telephone Encounter (Signed)
Pt return call; pt contact number [phone]

## 2018-01-21 NOTE — Telephone Encounter (Signed)
Spoke with pt's wife, Sedalia Muta. She is requesting his lab results from 01/13/18.  Dr. Craige Cotta - please advise. Thanks.

## 2018-01-21 NOTE — Telephone Encounter (Signed)
Pt is calling back [phone]

## 2018-04-25 DIAGNOSIS — R319 Hematuria, unspecified: Secondary | ICD-10-CM | POA: Diagnosis not present

## 2018-06-24 ENCOUNTER — Ambulatory Visit (INDEPENDENT_AMBULATORY_CARE_PROVIDER_SITE_OTHER): Payer: Medicare Other | Admitting: Pulmonary Disease

## 2018-06-24 ENCOUNTER — Encounter: Payer: Self-pay | Admitting: Pulmonary Disease

## 2018-06-24 VITALS — BP 124/74 | HR 112 | Ht 68.0 in | Wt 132.0 lb

## 2018-06-24 DIAGNOSIS — J432 Centrilobular emphysema: Secondary | ICD-10-CM | POA: Diagnosis not present

## 2018-06-24 DIAGNOSIS — E8801 Alpha-1-antitrypsin deficiency: Secondary | ICD-10-CM | POA: Diagnosis not present

## 2018-06-24 DIAGNOSIS — Z720 Tobacco use: Secondary | ICD-10-CM

## 2018-06-24 NOTE — Progress Notes (Signed)
[name]  Chief Complaint  Patient presents with  . Follow-up    Pt doing well overall, no changes since last ov.    Constitutional:  BP 124/74 (BP Location: Left Arm, Cuff Size: Normal)   Pulse (!) 112   Ht 5\' 8"  (1.727 m)   Wt 132 lb (59.9 kg)   SpO2 97%   BMI 20.07 kg/m   Past Medical History:  Arthritis.  Brief Summary:  Jeff Ball is a 68 y.o. 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  He is MZ alpha 1 heterozygote.  He continues to smoke cigarettes. He likes smoking and doesn't want to quit at this time.  He understands how this can impact his health.  He isn't having cough, wheeze, or chest congestion.  Doesn't feel like his breathing limits his activities.   Physical Exam:   Appearance - well kempt   ENMT - clear nasal mucosa, midline nasal  septum, no oral exudates, no LAN, trachea midline, poor dentition  Respiratory - normal chest wall, normal respiratory effort, no accessory muscle use, no wheeze/rales, decreased breath sounds  CV - s1s2 regular rate and rhythm, no murmurs, no peripheral edema, radial pulses symmetric  GI - soft, non tender, no masses  Lymph - no adenopathy noted in neck and axillary areas  MSK - uses a cane  Ext - no cyanosis, clubbing, or joint inflammation noted  Skin - no rashes, lesions, or ulcers  Neuro - normal strength, oriented x 3  Psych - normal mood and affect   Assessment/Plan:   COPD with emphysema. - not much regarding symptoms - prn albuterol - he will need pneumovax after August 2020  Tobacco abuse. - he isn't ready to quit - advised him that help is available when he is ready  Alpha 1 antitrypsin MZ heterozygote. - advised him to discuss with his family so that they can have further discussions with their physicians    Patient Instructions  Follow up in 1 year    Coralyn Helling, MD [place] [facility]/Critical [facility] Pager: [phone] 06/24/2018, 12:26  PM  Flow Sheet     Pulmonary tests:  PFT 06/21/17 >> FEV1 2.01 (59%), FEV1% 58, TLC 5.87 (84%), DLCO 51%, no BD A1AT 01/13/18 >> 180, MZ  Sleep tests:  CT chest 07/10/17 >> moderate centrilobular emphysema, atherosclerosis  Medications:   Allergies as of 06/24/2018   No Known Allergies     Medication List       Accurate as of June 24, 2018 12:26 PM. Always use your most recent med list.        albuterol 108 (90 Base) MCG/ACT inhaler Commonly known as:  VENTOLIN HFA Inhale 2 puffs into the lungs every 6 (six) hours as needed for wheezing or shortness of breath.   benzonatate 100 MG capsule Commonly known as:  TESSALON Take 1 capsule (100 mg total) by mouth 3 (three) times daily as needed for cough.   GOODY BODY PAIN 500-325 MG Pack Generic drug:  Aspirin-Acetaminophen as needed.   nicotine 14 mg/24hr patch Commonly known as:  NICODERM CQ - dosed in mg/24 hours Place 1 patch (14 mg total) onto the skin daily.   TUSSIN DM PO Take by mouth.   ZICAM COLD REMEDY Tbdp Take 1 tablet by mouth every 4 (four) hours as needed.       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  Family History:  His family history includes CVA in his father; Obesity in his sister.  Social History:  He  reports that he has been smoking cigarettes. He has a 94.00 pack-year smoking history. He has never used smokeless tobacco. He reports current alcohol use of about 30.0 standard drinks of alcohol per week. He reports that he does not use drugs.

## 2018-06-24 NOTE — Patient Instructions (Signed)
Follow up in 1 year.

## 2018-07-21 ENCOUNTER — Telehealth: Payer: Self-pay | Admitting: Pulmonary Disease

## 2018-07-21 ENCOUNTER — Other Ambulatory Visit: Payer: Self-pay | Admitting: Adult Health

## 2018-07-21 NOTE — Telephone Encounter (Signed)
Called patient unable to reach left message to give us a call back.

## 2018-07-22 NOTE — Telephone Encounter (Signed)
ATC pt, no answer. Left message for pt to call back.  

## 2018-07-22 NOTE — Telephone Encounter (Signed)
Primary Pulmonologist:Dr. Craige Cotta Last office visit and with whom: 06/24/2018 What do we see them for (pulmonary problems): Emphysema Last OV assessment/plan: COPD with emphysema. - not much regarding symptoms - prn albuterol - he will need pneumovax after August 2020  Tobacco abuse. - he isn't ready to quit - advised him that help is available when he is ready  Alpha 1 antitrypsin MZ heterozygote. - advised him to discuss with his family so that they can have further discussions with their physicians   Was appointment offered to patient (explain)?  Declined apt.   Reason for call: Coughing no mucus production.No Fever.  Since Saturday 07/19/18. Would like refill on Tessalon. Has taken Robitussin with little relief   TP please advise.

## 2018-07-22 NOTE — Telephone Encounter (Signed)
Recommend Mucinex DM twice daily as needed for cough and congestion Can refill Tessalon with 3 refills Office visit as planned if not improving will need sooner office visit Please contact office for sooner follow up if symptoms do not improve or worsen or seek emergency care

## 2018-07-23 NOTE — Telephone Encounter (Signed)
Called patient and phone went straight to voicemail. Refill has been sent in.

## 2018-07-24 NOTE — Telephone Encounter (Signed)
Called patient unable to reach left message to give us a call back.

## 2018-07-25 NOTE — Telephone Encounter (Signed)
ATC Patient.  Left message to call back. 

## 2018-07-28 NOTE — Telephone Encounter (Signed)
ATC pt, no answer. Left message for pt to call back.  

## 2018-07-29 NOTE — Telephone Encounter (Signed)
Called patient, unable to reach. LMTCB. Refill has already been sent in. Per protocol will close encounter.

## 2019-12-10 DIAGNOSIS — Z4789 Encounter for other orthopedic aftercare: Secondary | ICD-10-CM | POA: Diagnosis not present

## 2020-02-15 DIAGNOSIS — S72145A Nondisplaced intertrochanteric fracture of left femur, initial encounter for closed fracture: Secondary | ICD-10-CM | POA: Diagnosis not present

## 2020-02-15 DIAGNOSIS — Z4789 Encounter for other orthopedic aftercare: Secondary | ICD-10-CM | POA: Diagnosis not present

## 2020-06-03 DIAGNOSIS — Z4789 Encounter for other orthopedic aftercare: Secondary | ICD-10-CM | POA: Diagnosis not present

## 2022-07-20 DEATH — deceased
